# Patient Record
Sex: Male | Born: 1990 | Race: White | Hispanic: No | Smoking: Never smoker
Health system: Southern US, Community
[De-identification: ages and names within clinical notes are randomized; demographics above are authoritative.]

## PROBLEM LIST (undated history)

## (undated) DIAGNOSIS — T7840XA Allergy, unspecified, initial encounter: Secondary | ICD-10-CM

## (undated) HISTORY — DX: Allergy, unspecified, initial encounter: T78.40XA

---
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
## 2014-09-22 ENCOUNTER — Ambulatory Visit (INDEPENDENT_AMBULATORY_CARE_PROVIDER_SITE_OTHER): Payer: 59

## 2014-09-22 ENCOUNTER — Ambulatory Visit (INDEPENDENT_AMBULATORY_CARE_PROVIDER_SITE_OTHER): Payer: 59 | Admitting: Family Medicine

## 2014-09-22 VITALS — BP 120/70 | HR 72 | Temp 98.3°F | Resp 12 | Ht 70.5 in | Wt 208.0 lb

## 2014-09-22 DIAGNOSIS — M79644 Pain in right finger(s): Secondary | ICD-10-CM

## 2014-09-22 IMAGING — CR DG FINGER THUMB 2+V*R*
2 series · 2 of 2 positions shown · non-contrast
Comparison: None.

CLINICAL DATA: Pain.  No history of trauma

EXAM:
RIGHT THUMB 2+V

[AP (1 of 2)]
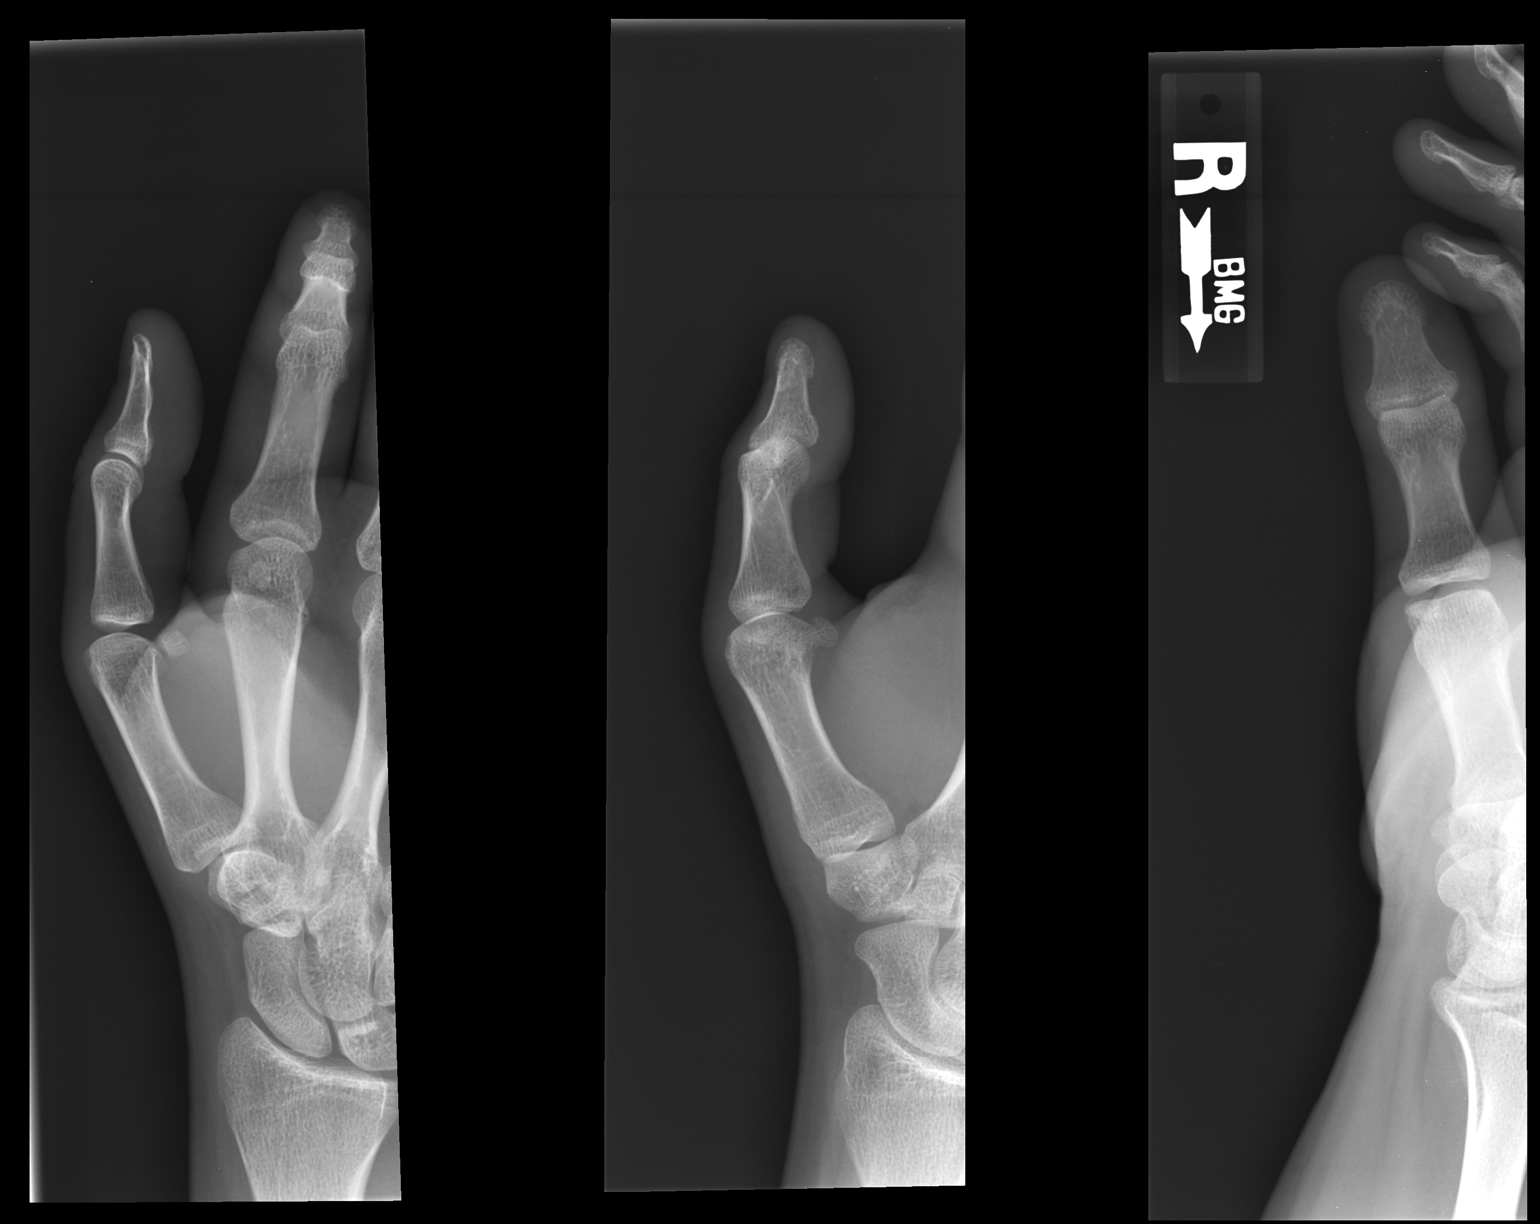

[AP (2 of 2)]
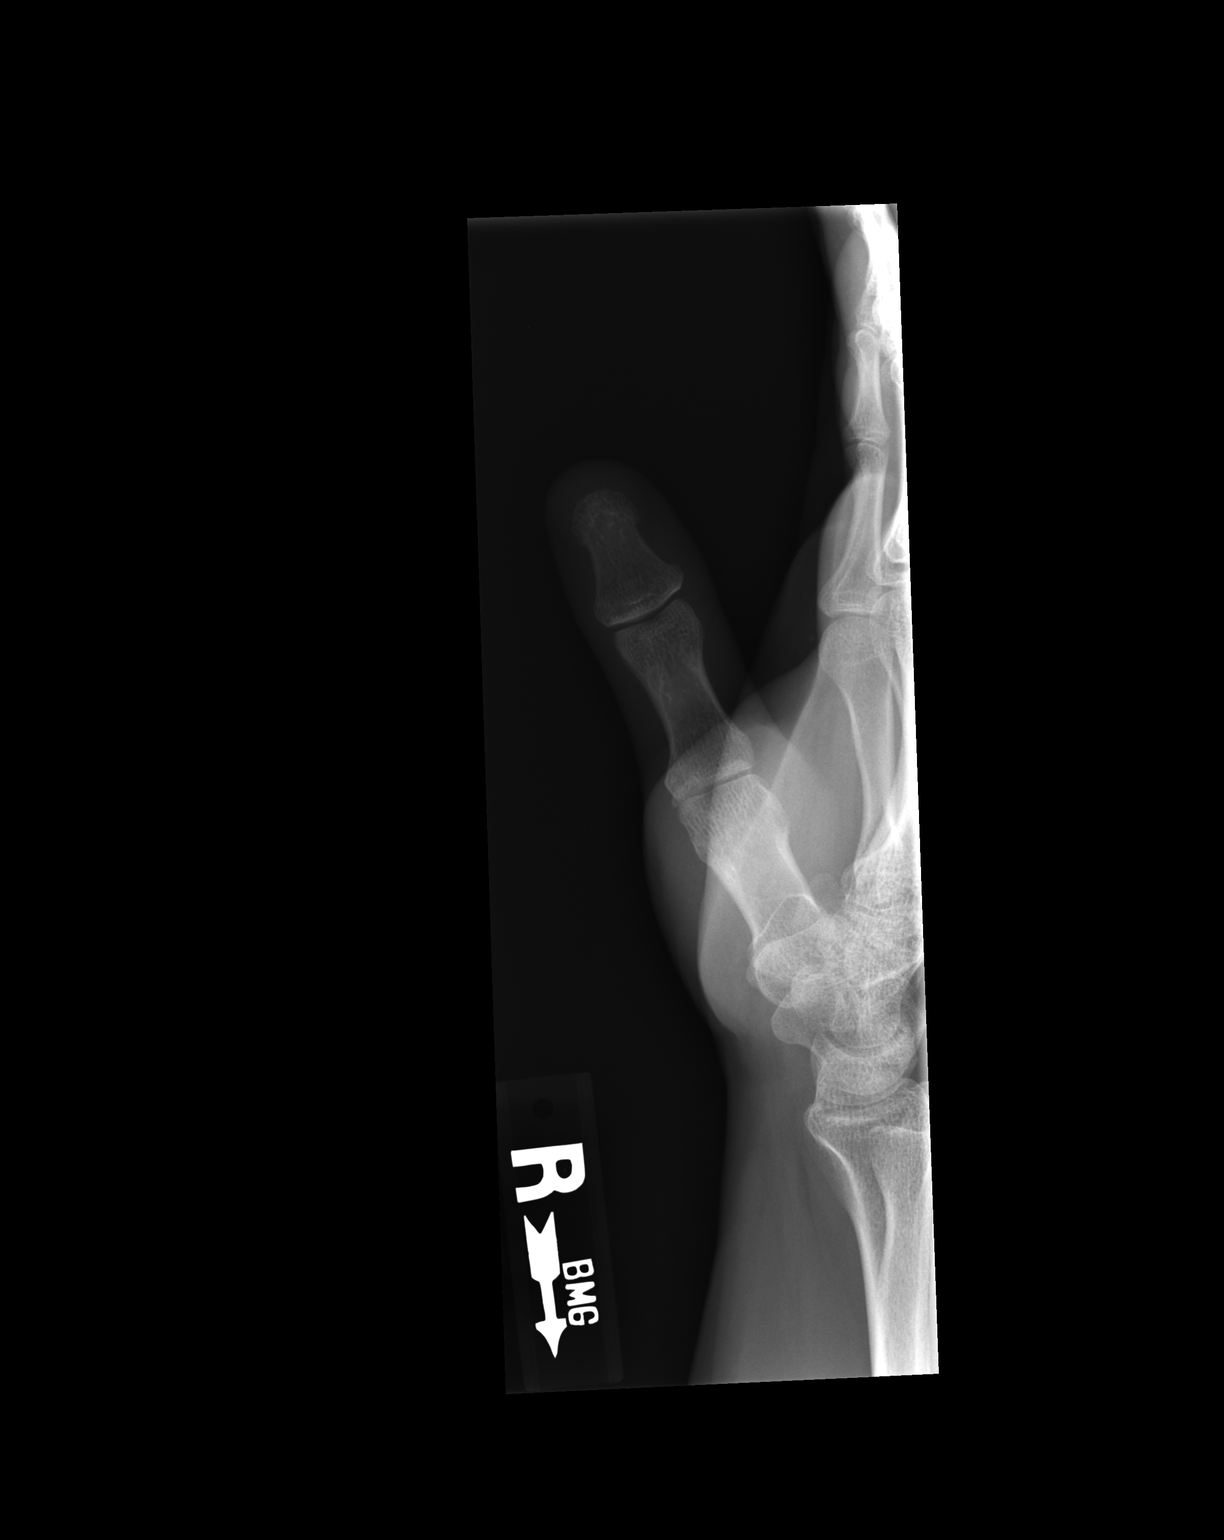

[2 of 2 positions shown; findings below may reference images not displayed]

FINDINGS: Frontal, oblique, and lateral views obtained. There is no
demonstrable fracture or dislocation. Joint spaces appear intact. No
erosive change.
IMPRESSION: No fracture or dislocation.  No appreciable arthropathy.

## 2014-09-22 MED ORDER — HYDROCODONE-ACETAMINOPHEN 5-325 MG PO TABS: 1.0000 | ORAL_TABLET | Freq: Four times a day (QID) | ORAL | Status: AC | PRN

## 2014-09-22 NOTE — Patient Instructions (Signed)
Although the bones appear normal on your x-ray, the clinical picture is one of a gamekeeper thumb. This will need urgent orthopedic attention. In the meantime I will you to continue to wear the splint provided.  Thumb Sprain Your exam shows you have a sprained thumb. This means the ligaments around the joint have been torn. Thumb sprains usually take 3-6 weeks to heal. However, severe, unstable sprains may need to be fixed surgically. Sometimes a small piece of bone is pulled off by the ligament. If this is not treated properly, a sprained thumb can lead to a painful, weak joint. Treatment helps reduce pain and shortens the period of disability. The thumb, and often the wrist, must remain splinted for the first 2-4 weeks to protect the joint. Keep your hand elevated and apply ice packs frequently to the injured area (20-30 minutes every 2-3 hours) for the next 2-4 days. This helps reduce swelling and control pain. Pain medicine may also be used for several days. Motion and strengthening exercises may later be prescribed for the joint to return to normal function. Be sure to see your doctor for follow-up because your thumb joint may require further support with splints, bandages or tape. Please see your doctor or go to the emergency room right away if you have increased pain despite proper treatment, or a numb, cold, or pale thumb. Document Released: 05/05/2004 Document Revised: 06/20/2011 Document Reviewed: 03/29/2008 [facility] Patient Information 2015 [place], [place]. This information is not intended to replace advice given to you by your health care provider. Make sure you discuss any questions you have with your health care provider.

## 2014-09-22 NOTE — Progress Notes (Signed)
This is a 24 year old gentleman who was canoeing yesterday and the paddle got stuck and he strained his right thumb. He has immediate pain and has had continued pain ever since. The pain is made worse by trying to extend the thumb and he cannot make a grip. He's been using a splint ever since the injury.  Patient works at a [employer] in [employer], [employer].  Objective: No acute distress, patient has a soft MCP splint for the thumb on his hand. BP 120/70 mmHg  Pulse 72  Temp(Src) 98.3 F (36.8 C) (Oral)  Resp 12  Ht 5' 10.5" (1.791 m)  Wt 208 lb (94.348 kg)  BMI 29.41 kg/m2  SpO2 98% Examination the thumb reveals diffuse swelling and mild ecchymosis. He has pain with any range of motion of the MCP joint. He is tender along the MCP joint particularly on the radial side. There is no gross deformity.  [facility] reading (PRIMARY) by  Dr. Milus Glazier:  Right thumb--> no fracture seen, no dislocation or graph   assessment: Gamekeeper type thumb injury with significant pain and disability at this point.  Plan: Thumb spica splint, urgent orthopedic referral to hand surgeon  Signed, Sheila Oats.D.

## 2016-06-12 DIAGNOSIS — H52223 Regular astigmatism, bilateral: Secondary | ICD-10-CM | POA: Diagnosis not present

## 2016-11-02 DIAGNOSIS — Z13220 Encounter for screening for lipoid disorders: Secondary | ICD-10-CM | POA: Diagnosis not present

## 2016-11-02 DIAGNOSIS — Z0000 Encounter for general adult medical examination without abnormal findings: Secondary | ICD-10-CM | POA: Diagnosis not present

## 2016-12-01 DIAGNOSIS — K602 Anal fissure, unspecified: Secondary | ICD-10-CM | POA: Diagnosis not present

## 2017-05-14 DIAGNOSIS — H52223 Regular astigmatism, bilateral: Secondary | ICD-10-CM | POA: Diagnosis not present

## 2017-08-23 DIAGNOSIS — S39012A Strain of muscle, fascia and tendon of lower back, initial encounter: Secondary | ICD-10-CM | POA: Diagnosis not present

## 2017-11-08 DIAGNOSIS — Z0000 Encounter for general adult medical examination without abnormal findings: Secondary | ICD-10-CM | POA: Diagnosis not present

## 2018-05-28 DIAGNOSIS — J029 Acute pharyngitis, unspecified: Secondary | ICD-10-CM | POA: Diagnosis not present

## 2018-10-18 DIAGNOSIS — F411 Generalized anxiety disorder: Secondary | ICD-10-CM | POA: Diagnosis not present

## 2018-10-22 DIAGNOSIS — E785 Hyperlipidemia, unspecified: Secondary | ICD-10-CM | POA: Diagnosis not present

## 2018-10-22 DIAGNOSIS — F439 Reaction to severe stress, unspecified: Secondary | ICD-10-CM | POA: Diagnosis not present

## 2018-11-01 DIAGNOSIS — F411 Generalized anxiety disorder: Secondary | ICD-10-CM | POA: Diagnosis not present

## 2018-11-14 DIAGNOSIS — F411 Generalized anxiety disorder: Secondary | ICD-10-CM | POA: Diagnosis not present

## 2018-11-16 DIAGNOSIS — E785 Hyperlipidemia, unspecified: Secondary | ICD-10-CM | POA: Diagnosis not present

## 2018-11-27 DIAGNOSIS — F439 Reaction to severe stress, unspecified: Secondary | ICD-10-CM | POA: Diagnosis not present

## 2018-11-27 DIAGNOSIS — E785 Hyperlipidemia, unspecified: Secondary | ICD-10-CM | POA: Diagnosis not present

## 2018-11-30 DIAGNOSIS — L718 Other rosacea: Secondary | ICD-10-CM | POA: Diagnosis not present

## 2018-12-03 DIAGNOSIS — F411 Generalized anxiety disorder: Secondary | ICD-10-CM | POA: Diagnosis not present

## 2019-02-26 DIAGNOSIS — M25471 Effusion, right ankle: Secondary | ICD-10-CM | POA: Diagnosis not present

## 2019-03-05 DIAGNOSIS — L718 Other rosacea: Secondary | ICD-10-CM | POA: Diagnosis not present

## 2019-03-20 DIAGNOSIS — R0789 Other chest pain: Secondary | ICD-10-CM | POA: Diagnosis not present

## 2019-03-20 DIAGNOSIS — R5383 Other fatigue: Secondary | ICD-10-CM | POA: Diagnosis not present

## 2019-03-20 DIAGNOSIS — G44201 Tension-type headache, unspecified, intractable: Secondary | ICD-10-CM | POA: Diagnosis not present

## 2019-03-21 DIAGNOSIS — R5383 Other fatigue: Secondary | ICD-10-CM | POA: Diagnosis not present

## 2019-03-21 DIAGNOSIS — R0789 Other chest pain: Secondary | ICD-10-CM | POA: Diagnosis not present

## 2019-03-21 DIAGNOSIS — G44201 Tension-type headache, unspecified, intractable: Secondary | ICD-10-CM | POA: Diagnosis not present

## 2019-06-04 DIAGNOSIS — L718 Other rosacea: Secondary | ICD-10-CM | POA: Diagnosis not present

## 2019-08-27 DIAGNOSIS — R635 Abnormal weight gain: Secondary | ICD-10-CM | POA: Diagnosis not present

## 2019-08-27 DIAGNOSIS — E669 Obesity, unspecified: Secondary | ICD-10-CM | POA: Diagnosis not present

## 2019-08-27 DIAGNOSIS — F439 Reaction to severe stress, unspecified: Secondary | ICD-10-CM | POA: Diagnosis not present

## 2019-08-27 DIAGNOSIS — E785 Hyperlipidemia, unspecified: Secondary | ICD-10-CM | POA: Diagnosis not present

## 2019-09-30 DIAGNOSIS — L918 Other hypertrophic disorders of the skin: Secondary | ICD-10-CM | POA: Diagnosis not present

## 2020-03-03 DIAGNOSIS — Z20822 Contact with and (suspected) exposure to covid-19: Secondary | ICD-10-CM | POA: Diagnosis not present

## 2020-03-04 DIAGNOSIS — R52 Pain, unspecified: Secondary | ICD-10-CM | POA: Diagnosis not present

## 2020-03-04 DIAGNOSIS — J019 Acute sinusitis, unspecified: Secondary | ICD-10-CM | POA: Diagnosis not present

## 2020-03-04 DIAGNOSIS — B9689 Other specified bacterial agents as the cause of diseases classified elsewhere: Secondary | ICD-10-CM | POA: Diagnosis not present

## 2020-03-04 DIAGNOSIS — Z20822 Contact with and (suspected) exposure to covid-19: Secondary | ICD-10-CM | POA: Diagnosis not present

## 2020-05-21 DIAGNOSIS — M533 Sacrococcygeal disorders, not elsewhere classified: Secondary | ICD-10-CM | POA: Diagnosis not present

## 2020-05-21 DIAGNOSIS — M9905 Segmental and somatic dysfunction of pelvic region: Secondary | ICD-10-CM | POA: Diagnosis not present

## 2020-05-21 DIAGNOSIS — M9903 Segmental and somatic dysfunction of lumbar region: Secondary | ICD-10-CM | POA: Diagnosis not present

## 2020-05-21 DIAGNOSIS — M5137 Other intervertebral disc degeneration, lumbosacral region: Secondary | ICD-10-CM | POA: Diagnosis not present

## 2020-05-21 DIAGNOSIS — M25551 Pain in right hip: Secondary | ICD-10-CM | POA: Diagnosis not present

## 2020-05-25 DIAGNOSIS — M5137 Other intervertebral disc degeneration, lumbosacral region: Secondary | ICD-10-CM | POA: Diagnosis not present

## 2020-05-25 DIAGNOSIS — M9905 Segmental and somatic dysfunction of pelvic region: Secondary | ICD-10-CM | POA: Diagnosis not present

## 2020-05-25 DIAGNOSIS — M25551 Pain in right hip: Secondary | ICD-10-CM | POA: Diagnosis not present

## 2020-05-25 DIAGNOSIS — M9903 Segmental and somatic dysfunction of lumbar region: Secondary | ICD-10-CM | POA: Diagnosis not present

## 2020-05-26 DIAGNOSIS — M25551 Pain in right hip: Secondary | ICD-10-CM | POA: Diagnosis not present

## 2020-05-26 DIAGNOSIS — M9905 Segmental and somatic dysfunction of pelvic region: Secondary | ICD-10-CM | POA: Diagnosis not present

## 2020-05-26 DIAGNOSIS — M5137 Other intervertebral disc degeneration, lumbosacral region: Secondary | ICD-10-CM | POA: Diagnosis not present

## 2020-05-26 DIAGNOSIS — M9903 Segmental and somatic dysfunction of lumbar region: Secondary | ICD-10-CM | POA: Diagnosis not present

## 2020-06-01 DIAGNOSIS — M9903 Segmental and somatic dysfunction of lumbar region: Secondary | ICD-10-CM | POA: Diagnosis not present

## 2020-06-01 DIAGNOSIS — M5137 Other intervertebral disc degeneration, lumbosacral region: Secondary | ICD-10-CM | POA: Diagnosis not present

## 2020-06-01 DIAGNOSIS — M9905 Segmental and somatic dysfunction of pelvic region: Secondary | ICD-10-CM | POA: Diagnosis not present

## 2020-06-01 DIAGNOSIS — M25551 Pain in right hip: Secondary | ICD-10-CM | POA: Diagnosis not present

## 2020-06-02 DIAGNOSIS — M47816 Spondylosis without myelopathy or radiculopathy, lumbar region: Secondary | ICD-10-CM | POA: Diagnosis not present

## 2020-06-02 DIAGNOSIS — M9905 Segmental and somatic dysfunction of pelvic region: Secondary | ICD-10-CM | POA: Diagnosis not present

## 2020-06-02 DIAGNOSIS — M5137 Other intervertebral disc degeneration, lumbosacral region: Secondary | ICD-10-CM | POA: Diagnosis not present

## 2020-06-02 DIAGNOSIS — M9903 Segmental and somatic dysfunction of lumbar region: Secondary | ICD-10-CM | POA: Diagnosis not present

## 2020-06-02 DIAGNOSIS — M25551 Pain in right hip: Secondary | ICD-10-CM | POA: Diagnosis not present

## 2020-06-03 DIAGNOSIS — M9903 Segmental and somatic dysfunction of lumbar region: Secondary | ICD-10-CM | POA: Diagnosis not present

## 2020-06-03 DIAGNOSIS — M25551 Pain in right hip: Secondary | ICD-10-CM | POA: Diagnosis not present

## 2020-06-03 DIAGNOSIS — M9905 Segmental and somatic dysfunction of pelvic region: Secondary | ICD-10-CM | POA: Diagnosis not present

## 2020-06-03 DIAGNOSIS — M5137 Other intervertebral disc degeneration, lumbosacral region: Secondary | ICD-10-CM | POA: Diagnosis not present

## 2020-06-04 DIAGNOSIS — D2261 Melanocytic nevi of right upper limb, including shoulder: Secondary | ICD-10-CM | POA: Diagnosis not present

## 2020-06-04 DIAGNOSIS — L718 Other rosacea: Secondary | ICD-10-CM | POA: Diagnosis not present

## 2020-06-04 DIAGNOSIS — L814 Other melanin hyperpigmentation: Secondary | ICD-10-CM | POA: Diagnosis not present

## 2020-06-04 DIAGNOSIS — D2262 Melanocytic nevi of left upper limb, including shoulder: Secondary | ICD-10-CM | POA: Diagnosis not present

## 2020-06-08 DIAGNOSIS — M25551 Pain in right hip: Secondary | ICD-10-CM | POA: Diagnosis not present

## 2020-06-08 DIAGNOSIS — M9903 Segmental and somatic dysfunction of lumbar region: Secondary | ICD-10-CM | POA: Diagnosis not present

## 2020-06-08 DIAGNOSIS — M5137 Other intervertebral disc degeneration, lumbosacral region: Secondary | ICD-10-CM | POA: Diagnosis not present

## 2020-06-08 DIAGNOSIS — M9905 Segmental and somatic dysfunction of pelvic region: Secondary | ICD-10-CM | POA: Diagnosis not present

## 2020-06-09 DIAGNOSIS — M5137 Other intervertebral disc degeneration, lumbosacral region: Secondary | ICD-10-CM | POA: Diagnosis not present

## 2020-06-09 DIAGNOSIS — M9905 Segmental and somatic dysfunction of pelvic region: Secondary | ICD-10-CM | POA: Diagnosis not present

## 2020-06-09 DIAGNOSIS — M25551 Pain in right hip: Secondary | ICD-10-CM | POA: Diagnosis not present

## 2020-06-09 DIAGNOSIS — M9903 Segmental and somatic dysfunction of lumbar region: Secondary | ICD-10-CM | POA: Diagnosis not present

## 2020-06-11 DIAGNOSIS — M9903 Segmental and somatic dysfunction of lumbar region: Secondary | ICD-10-CM | POA: Diagnosis not present

## 2020-06-11 DIAGNOSIS — M5137 Other intervertebral disc degeneration, lumbosacral region: Secondary | ICD-10-CM | POA: Diagnosis not present

## 2020-06-11 DIAGNOSIS — M9905 Segmental and somatic dysfunction of pelvic region: Secondary | ICD-10-CM | POA: Diagnosis not present

## 2020-06-11 DIAGNOSIS — M25551 Pain in right hip: Secondary | ICD-10-CM | POA: Diagnosis not present

## 2020-06-15 DIAGNOSIS — M5137 Other intervertebral disc degeneration, lumbosacral region: Secondary | ICD-10-CM | POA: Diagnosis not present

## 2020-06-15 DIAGNOSIS — M25551 Pain in right hip: Secondary | ICD-10-CM | POA: Diagnosis not present

## 2020-06-15 DIAGNOSIS — M9905 Segmental and somatic dysfunction of pelvic region: Secondary | ICD-10-CM | POA: Diagnosis not present

## 2020-06-15 DIAGNOSIS — M9903 Segmental and somatic dysfunction of lumbar region: Secondary | ICD-10-CM | POA: Diagnosis not present

## 2020-06-17 DIAGNOSIS — M9905 Segmental and somatic dysfunction of pelvic region: Secondary | ICD-10-CM | POA: Diagnosis not present

## 2020-06-17 DIAGNOSIS — M9903 Segmental and somatic dysfunction of lumbar region: Secondary | ICD-10-CM | POA: Diagnosis not present

## 2020-06-17 DIAGNOSIS — M5137 Other intervertebral disc degeneration, lumbosacral region: Secondary | ICD-10-CM | POA: Diagnosis not present

## 2020-06-17 DIAGNOSIS — M25551 Pain in right hip: Secondary | ICD-10-CM | POA: Diagnosis not present

## 2020-06-22 DIAGNOSIS — M9905 Segmental and somatic dysfunction of pelvic region: Secondary | ICD-10-CM | POA: Diagnosis not present

## 2020-06-22 DIAGNOSIS — M25551 Pain in right hip: Secondary | ICD-10-CM | POA: Diagnosis not present

## 2020-06-22 DIAGNOSIS — M5137 Other intervertebral disc degeneration, lumbosacral region: Secondary | ICD-10-CM | POA: Diagnosis not present

## 2020-06-22 DIAGNOSIS — M9903 Segmental and somatic dysfunction of lumbar region: Secondary | ICD-10-CM | POA: Diagnosis not present

## 2020-06-25 DIAGNOSIS — M9903 Segmental and somatic dysfunction of lumbar region: Secondary | ICD-10-CM | POA: Diagnosis not present

## 2020-06-25 DIAGNOSIS — M5137 Other intervertebral disc degeneration, lumbosacral region: Secondary | ICD-10-CM | POA: Diagnosis not present

## 2020-06-25 DIAGNOSIS — M9905 Segmental and somatic dysfunction of pelvic region: Secondary | ICD-10-CM | POA: Diagnosis not present

## 2020-06-25 DIAGNOSIS — M25551 Pain in right hip: Secondary | ICD-10-CM | POA: Diagnosis not present

## 2020-07-02 DIAGNOSIS — M5137 Other intervertebral disc degeneration, lumbosacral region: Secondary | ICD-10-CM | POA: Diagnosis not present

## 2020-07-02 DIAGNOSIS — M9903 Segmental and somatic dysfunction of lumbar region: Secondary | ICD-10-CM | POA: Diagnosis not present

## 2020-07-02 DIAGNOSIS — M9905 Segmental and somatic dysfunction of pelvic region: Secondary | ICD-10-CM | POA: Diagnosis not present

## 2020-07-02 DIAGNOSIS — M25551 Pain in right hip: Secondary | ICD-10-CM | POA: Diagnosis not present

## 2020-07-09 DIAGNOSIS — M9905 Segmental and somatic dysfunction of pelvic region: Secondary | ICD-10-CM | POA: Diagnosis not present

## 2020-07-09 DIAGNOSIS — M5137 Other intervertebral disc degeneration, lumbosacral region: Secondary | ICD-10-CM | POA: Diagnosis not present

## 2020-07-09 DIAGNOSIS — M25551 Pain in right hip: Secondary | ICD-10-CM | POA: Diagnosis not present

## 2020-07-09 DIAGNOSIS — M9903 Segmental and somatic dysfunction of lumbar region: Secondary | ICD-10-CM | POA: Diagnosis not present

## 2020-07-16 DIAGNOSIS — M5137 Other intervertebral disc degeneration, lumbosacral region: Secondary | ICD-10-CM | POA: Diagnosis not present

## 2020-07-16 DIAGNOSIS — M25551 Pain in right hip: Secondary | ICD-10-CM | POA: Diagnosis not present

## 2020-07-16 DIAGNOSIS — M9905 Segmental and somatic dysfunction of pelvic region: Secondary | ICD-10-CM | POA: Diagnosis not present

## 2020-07-16 DIAGNOSIS — M9903 Segmental and somatic dysfunction of lumbar region: Secondary | ICD-10-CM | POA: Diagnosis not present

## 2020-08-31 DIAGNOSIS — Z130 Encounter for screening for diseases of the blood and blood-forming organs and certain disorders involving the immune mechanism: Secondary | ICD-10-CM | POA: Diagnosis not present

## 2020-08-31 DIAGNOSIS — F410 Panic disorder [episodic paroxysmal anxiety] without agoraphobia: Secondary | ICD-10-CM | POA: Diagnosis not present

## 2020-08-31 DIAGNOSIS — R748 Abnormal levels of other serum enzymes: Secondary | ICD-10-CM | POA: Diagnosis not present

## 2020-08-31 DIAGNOSIS — Z113 Encounter for screening for infections with a predominantly sexual mode of transmission: Secondary | ICD-10-CM | POA: Diagnosis not present

## 2020-08-31 DIAGNOSIS — Z0000 Encounter for general adult medical examination without abnormal findings: Secondary | ICD-10-CM | POA: Diagnosis not present

## 2020-08-31 DIAGNOSIS — F411 Generalized anxiety disorder: Secondary | ICD-10-CM | POA: Diagnosis not present

## 2021-06-04 DIAGNOSIS — D2272 Melanocytic nevi of left lower limb, including hip: Secondary | ICD-10-CM | POA: Diagnosis not present

## 2021-06-04 DIAGNOSIS — D1801 Hemangioma of skin and subcutaneous tissue: Secondary | ICD-10-CM | POA: Diagnosis not present

## 2021-06-04 DIAGNOSIS — L718 Other rosacea: Secondary | ICD-10-CM | POA: Diagnosis not present

## 2021-06-04 DIAGNOSIS — D225 Melanocytic nevi of trunk: Secondary | ICD-10-CM | POA: Diagnosis not present

## 2021-06-24 ENCOUNTER — Other Ambulatory Visit: Payer: Self-pay

## 2021-06-24 ENCOUNTER — Ambulatory Visit (HOSPITAL_COMMUNITY)
Admission: EM | Admit: 2021-06-24 | Discharge: 2021-06-24 | Disposition: A | Discharge status: Home / Self Care | Payer: BC Managed Care – PPO | Attending: Emergency Medicine | Admitting: Emergency Medicine

## 2021-06-24 ENCOUNTER — Encounter (HOSPITAL_COMMUNITY)
Admission: EM | Disposition: A | Discharge status: Home / Self Care | Payer: Self-pay | Source: Home / Self Care | Attending: Emergency Medicine

## 2021-06-24 ENCOUNTER — Other Ambulatory Visit: Payer: Self-pay | Admitting: Family Medicine

## 2021-06-24 ENCOUNTER — Emergency Department (HOSPITAL_COMMUNITY): Payer: BC Managed Care – PPO | Admitting: Anesthesiology

## 2021-06-24 ENCOUNTER — Ambulatory Visit
Admission: RE | Admit: 2021-06-24 | Discharge: 2021-06-24 | Disposition: A | Discharge status: Home / Self Care | Payer: Self-pay | Source: Ambulatory Visit | Attending: Family Medicine | Admitting: Family Medicine

## 2021-06-24 ENCOUNTER — Encounter (HOSPITAL_COMMUNITY): Payer: Self-pay | Admitting: Emergency Medicine

## 2021-06-24 ENCOUNTER — Other Ambulatory Visit: Payer: Self-pay | Admitting: Emergency Medicine

## 2021-06-24 DIAGNOSIS — K3580 Unspecified acute appendicitis: Secondary | ICD-10-CM | POA: Insufficient documentation

## 2021-06-24 DIAGNOSIS — K3189 Other diseases of stomach and duodenum: Secondary | ICD-10-CM | POA: Diagnosis not present

## 2021-06-24 DIAGNOSIS — Z20822 Contact with and (suspected) exposure to covid-19: Secondary | ICD-10-CM | POA: Insufficient documentation

## 2021-06-24 DIAGNOSIS — K37 Unspecified appendicitis: Secondary | ICD-10-CM | POA: Diagnosis not present

## 2021-06-24 DIAGNOSIS — R1030 Lower abdominal pain, unspecified: Secondary | ICD-10-CM | POA: Diagnosis not present

## 2021-06-24 HISTORY — PX: LAPAROSCOPIC APPENDECTOMY: SHX408

## 2021-06-24 LAB — URINALYSIS, ROUTINE W REFLEX MICROSCOPIC
Bilirubin Urine: NEGATIVE
Glucose, UA: NEGATIVE mg/dL
Ketones, ur: 5 mg/dL — AB
Leukocytes,Ua: NEGATIVE
Nitrite: NEGATIVE
Protein, ur: NEGATIVE mg/dL
Specific Gravity, Urine: >1.046 — ABNORMAL HIGH (ref 1.005–1.030)
pH: 5 (ref 5.0–8.0)

## 2021-06-24 LAB — CBC
HCT: 45.2 % (ref 39.0–52.0)
Hemoglobin: 15 g/dL (ref 13.0–17.0)
MCH: 30.9 pg (ref 26.0–34.0)
MCHC: 33.2 g/dL (ref 30.0–36.0)
MCV: 93 fL (ref 80.0–100.0)
Platelets: 347 10*3/uL (ref 150–400)
RBC: 4.86 MIL/uL (ref 4.22–5.81)
RDW: 12.2 % (ref 11.5–15.5)
WBC: 7.7 10*3/uL (ref 4.0–10.5)
nRBC: 0 % (ref 0.0–0.2)

## 2021-06-24 LAB — COMPREHENSIVE METABOLIC PANEL
ALT: 43 U/L (ref 0–44)
AST: 29 U/L (ref 15–41)
Albumin: 4.3 g/dL (ref 3.5–5.0)
Alkaline Phosphatase: 41 U/L (ref 38–126)
Anion gap: 11 (ref 5–15)
BUN: 10 mg/dL (ref 6–20)
CO2: 23 mmol/L (ref 22–32)
Calcium: 9 mg/dL (ref 8.9–10.3)
Chloride: 104 mmol/L (ref 98–111)
Creatinine, Ser: 1.01 mg/dL (ref 0.61–1.24)
GFR, Estimated: >60 mL/min (ref >60)
Glucose, Bld: 87 mg/dL (ref 70–99)
Potassium: 4.2 mmol/L (ref 3.5–5.1)
Sodium: 138 mmol/L (ref 135–145)
Total Bilirubin: 0.7 mg/dL (ref 0.3–1.2)
Total Protein: 7.1 g/dL (ref 6.5–8.1)

## 2021-06-24 LAB — RESP PANEL BY RT-PCR (FLU A&B, COVID) ARPGX2
Influenza A by PCR: NEGATIVE
Influenza B by PCR: NEGATIVE
SARS Coronavirus 2 by RT PCR: NEGATIVE

## 2021-06-24 LAB — LIPASE, BLOOD: Lipase: 31 U/L (ref 11–51)

## 2021-06-24 SURGERY — APPENDECTOMY, LAPAROSCOPIC
Anesthesia: General | Site: Abdomen

## 2021-06-24 MED ORDER — IOPAMIDOL (ISOVUE-300) INJECTION 61%
100.0000 mL | Freq: Once | INTRAVENOUS | Status: AC | PRN
  Administered 2021-06-24: 100 mL via INTRAVENOUS

## 2021-06-24 MED ORDER — PHENYLEPHRINE 40 MCG/ML (10ML) SYRINGE FOR IV PUSH (FOR BLOOD PRESSURE SUPPORT)
PREFILLED_SYRINGE | INTRAVENOUS | Status: AC
  Filled 2021-06-24: qty 10

## 2021-06-24 MED ORDER — FENTANYL CITRATE (PF) 100 MCG/2ML IJ SOLN
INTRAMUSCULAR | Status: AC
  Filled 2021-06-24: qty 2

## 2021-06-24 MED ORDER — SODIUM CHLORIDE 0.9 % IR SOLN
Status: DC | PRN
  Administered 2021-06-24: 1000 mL

## 2021-06-24 MED ORDER — SUCCINYLCHOLINE 20MG/ML (10ML) SYRINGE FOR MEDFUSION PUMP - OPTIME
INTRAMUSCULAR | Status: DC | PRN
Start: 2021-06-24 — End: 2021-06-24
  Administered 2021-06-24: 160 mg via INTRAVENOUS

## 2021-06-24 MED ORDER — BUPIVACAINE HCL (PF) 0.25 % IJ SOLN
INTRAMUSCULAR | Status: AC
  Filled 2021-06-24: qty 30

## 2021-06-24 MED ORDER — FENTANYL CITRATE (PF) 250 MCG/5ML IJ SOLN
INTRAMUSCULAR | Status: AC
  Filled 2021-06-24: qty 5

## 2021-06-24 MED ORDER — ACETAMINOPHEN 500 MG PO TABS
ORAL_TABLET | ORAL | Status: AC
  Administered 2021-06-24: 1000 mg
  Filled 2021-06-24: qty 2

## 2021-06-24 MED ORDER — SUGAMMADEX SODIUM 200 MG/2ML IV SOLN
INTRAVENOUS | Status: DC | PRN
  Administered 2021-06-24: 250 mg via INTRAVENOUS

## 2021-06-24 MED ORDER — PHENYLEPHRINE HCL (PRESSORS) 10 MG/ML IV SOLN
INTRAVENOUS | Status: DC | PRN
Start: 2021-06-24 — End: 2021-06-24
  Administered 2021-06-24: 40 ug via INTRAVENOUS

## 2021-06-24 MED ORDER — LIDOCAINE HCL (CARDIAC) PF 100 MG/5ML IV SOSY
PREFILLED_SYRINGE | INTRAVENOUS | Status: DC | PRN
  Administered 2021-06-24: 100 mg via INTRAVENOUS

## 2021-06-24 MED ORDER — ROCURONIUM 10MG/ML (10ML) SYRINGE FOR MEDFUSION PUMP - OPTIME
INTRAVENOUS | Status: DC | PRN
  Administered 2021-06-24: 40 mg via INTRAVENOUS

## 2021-06-24 MED ORDER — PROPOFOL 10 MG/ML IV BOLUS
INTRAVENOUS | Status: DC | PRN
  Administered 2021-06-24: 200 mg via INTRAVENOUS

## 2021-06-24 MED ORDER — AMISULPRIDE (ANTIEMETIC) 5 MG/2ML IV SOLN: 10.0000 mg | Freq: Once | INTRAVENOUS | Status: DC | PRN

## 2021-06-24 MED ORDER — DEXAMETHASONE SODIUM PHOSPHATE 10 MG/ML IJ SOLN
INTRAMUSCULAR | Status: DC | PRN
  Administered 2021-06-24: 10 mg via INTRAVENOUS

## 2021-06-24 MED ORDER — FENTANYL CITRATE (PF) 100 MCG/2ML IJ SOLN
25.0000 ug | INTRAMUSCULAR | Status: DC | PRN
  Administered 2021-06-24: 50 ug via INTRAVENOUS

## 2021-06-24 MED ORDER — SCOPOLAMINE 1 MG/3DAYS TD PT72
MEDICATED_PATCH | TRANSDERMAL | Status: DC | PRN
  Administered 2021-06-24: 1 via TRANSDERMAL

## 2021-06-24 MED ORDER — 0.9 % SODIUM CHLORIDE (POUR BTL) OPTIME
TOPICAL | Status: DC | PRN
  Administered 2021-06-24: 1000 mL

## 2021-06-24 MED ORDER — LACTATED RINGERS IV SOLN: INTRAVENOUS | Status: DC | PRN

## 2021-06-24 MED ORDER — ONDANSETRON HCL 4 MG/2ML IJ SOLN
INTRAMUSCULAR | Status: AC
  Filled 2021-06-24: qty 2

## 2021-06-24 MED ORDER — DEXAMETHASONE SODIUM PHOSPHATE 10 MG/ML IJ SOLN
INTRAMUSCULAR | Status: AC
  Filled 2021-06-24: qty 1

## 2021-06-24 MED ORDER — TRAMADOL HCL 50 MG PO TABS: 50.0000 mg | ORAL_TABLET | Freq: Four times a day (QID) | ORAL | 0 refills | Status: AC | PRN

## 2021-06-24 MED ORDER — SCOPOLAMINE 1 MG/3DAYS TD PT72
MEDICATED_PATCH | TRANSDERMAL | Status: AC
  Filled 2021-06-24: qty 1

## 2021-06-24 MED ORDER — BUPIVACAINE HCL 0.25 % IJ SOLN
INTRAMUSCULAR | Status: DC | PRN
  Administered 2021-06-24: 6 mL

## 2021-06-24 MED ORDER — ROCURONIUM BROMIDE 10 MG/ML (PF) SYRINGE
PREFILLED_SYRINGE | INTRAVENOUS | Status: AC
  Filled 2021-06-24: qty 10

## 2021-06-24 MED ORDER — FENTANYL CITRATE (PF) 250 MCG/5ML IJ SOLN
INTRAMUSCULAR | Status: DC | PRN
  Administered 2021-06-24: 100 ug via INTRAVENOUS
  Administered 2021-06-24: 50 ug via INTRAVENOUS

## 2021-06-24 MED ORDER — SODIUM CHLORIDE 0.9 % IV SOLN
2.0000 g | Freq: Once | INTRAVENOUS | Status: AC
  Administered 2021-06-24: 2 g via INTRAVENOUS
  Filled 2021-06-24: qty 20

## 2021-06-24 MED ORDER — BSS IO SOLN
INTRAOCULAR | Status: AC
  Filled 2021-06-24: qty 15

## 2021-06-24 MED ORDER — PROCHLORPERAZINE EDISYLATE 10 MG/2ML IJ SOLN: 10.0000 mg | Freq: Once | INTRAMUSCULAR | Status: DC

## 2021-06-24 MED ORDER — MIDAZOLAM HCL 2 MG/2ML IJ SOLN
INTRAMUSCULAR | Status: DC | PRN
  Administered 2021-06-24: 2 mg via INTRAVENOUS

## 2021-06-24 MED ORDER — MIDAZOLAM HCL 2 MG/2ML IJ SOLN
INTRAMUSCULAR | Status: AC
  Filled 2021-06-24: qty 2

## 2021-06-24 MED ORDER — CHLORHEXIDINE GLUCONATE 0.12 % MT SOLN
OROMUCOSAL | Status: AC
  Administered 2021-06-24: 15 mL
  Filled 2021-06-24: qty 15

## 2021-06-24 MED ORDER — ONDANSETRON HCL 4 MG/2ML IJ SOLN
INTRAMUSCULAR | Status: DC | PRN
  Administered 2021-06-24: 4 mg via INTRAVENOUS

## 2021-06-24 MED ORDER — CELECOXIB 200 MG PO CAPS
ORAL_CAPSULE | ORAL | Status: AC
  Administered 2021-06-24: 200 mg
  Filled 2021-06-24: qty 1

## 2021-06-24 MED ORDER — METRONIDAZOLE 500 MG/100ML IV SOLN
500.0000 mg | Freq: Once | INTRAVENOUS | Status: AC
  Administered 2021-06-24: 500 mg via INTRAVENOUS
  Filled 2021-06-24: qty 100

## 2021-06-24 MED ORDER — SUGAMMADEX SODIUM 500 MG/5ML IV SOLN
INTRAVENOUS | Status: AC
  Filled 2021-06-24: qty 5

## 2021-06-24 SURGICAL SUPPLY — 48 items
APPLIER CLIP 5 13 M/L LIGAMAX5 (MISCELLANEOUS)
BAG COUNTER SPONGE SURGICOUNT (BAG) ×2 IMPLANT
BLADE CLIPPER SURG (BLADE) IMPLANT
CANISTER SUCT 3000ML PPV (MISCELLANEOUS) ×2 IMPLANT
CHLORAPREP W/TINT 26 (MISCELLANEOUS) ×2 IMPLANT
CLIP APPLIE 5 13 M/L LIGAMAX5 (MISCELLANEOUS) IMPLANT
CLIP LIGATING HEMO LOK XL GOLD (MISCELLANEOUS) ×2 IMPLANT
COVER SURGICAL LIGHT HANDLE (MISCELLANEOUS) ×2 IMPLANT
COVER TRANSDUCER ULTRASND (DRAPES) ×2 IMPLANT
DERMABOND ADHESIVE PROPEN (GAUZE/BANDAGES/DRESSINGS) ×1
DERMABOND ADVANCED (GAUZE/BANDAGES/DRESSINGS) ×1
DERMABOND ADVANCED .7 DNX12 (GAUZE/BANDAGES/DRESSINGS) ×1 IMPLANT
DERMABOND ADVANCED .7 DNX6 (GAUZE/BANDAGES/DRESSINGS) IMPLANT
ELECT REM PT RETURN 9FT ADLT (ELECTROSURGICAL) ×2
ELECTRODE REM PT RTRN 9FT ADLT (ELECTROSURGICAL) ×1 IMPLANT
ENDOLOOP SUT PDS II  0 18 (SUTURE)
ENDOLOOP SUT PDS II 0 18 (SUTURE) IMPLANT
GAUZE 4X4 16PLY ~~LOC~~+RFID DBL (SPONGE) ×1 IMPLANT
GLOVE SURG SYN 7.5  E (GLOVE) ×2
GLOVE SURG SYN 7.5 E (GLOVE) ×1 IMPLANT
GLOVE SURG SYN 7.5 PF PI (GLOVE) ×1 IMPLANT
GOWN STRL REUS W/ TWL LRG LVL3 (GOWN DISPOSABLE) ×2 IMPLANT
GOWN STRL REUS W/ TWL XL LVL3 (GOWN DISPOSABLE) ×1 IMPLANT
GOWN STRL REUS W/TWL LRG LVL3 (GOWN DISPOSABLE) ×4
GOWN STRL REUS W/TWL XL LVL3 (GOWN DISPOSABLE) ×2
GRASPER SUT TROCAR 14GX15 (MISCELLANEOUS) ×2 IMPLANT
KIT BASIN OR (CUSTOM PROCEDURE TRAY) ×2 IMPLANT
KIT TURNOVER KIT B (KITS) ×2 IMPLANT
NDL INSUFFLATION 14GA 120MM (NEEDLE) ×1 IMPLANT
NEEDLE INSUFFLATION 14GA 120MM (NEEDLE) ×2 IMPLANT
NS IRRIG 1000ML POUR BTL (IV SOLUTION) ×2 IMPLANT
PAD ARMBOARD 7.5X6 YLW CONV (MISCELLANEOUS) ×4 IMPLANT
SCISSORS LAP 5X35 DISP (ENDOMECHANICALS) ×2 IMPLANT
SET IRRIG TUBING LAPAROSCOPIC (IRRIGATION / IRRIGATOR) ×2 IMPLANT
SET TUBE SMOKE EVAC HIGH FLOW (TUBING) ×2 IMPLANT
SLEEVE ENDOPATH XCEL 5M (ENDOMECHANICALS) ×2 IMPLANT
SPECIMEN JAR SMALL (MISCELLANEOUS) ×2 IMPLANT
SPONGE T-LAP 18X18 ~~LOC~~+RFID (SPONGE) ×1 IMPLANT
SUT MNCRL AB 4-0 PS2 18 (SUTURE) ×2 IMPLANT
TOWEL GREEN STERILE (TOWEL DISPOSABLE) ×2 IMPLANT
TOWEL GREEN STERILE FF (TOWEL DISPOSABLE) ×2 IMPLANT
TRAY FOLEY W/BAG SLVR 16FR (SET/KITS/TRAYS/PACK)
TRAY FOLEY W/BAG SLVR 16FR ST (SET/KITS/TRAYS/PACK) IMPLANT
TRAY LAPAROSCOPIC MC (CUSTOM PROCEDURE TRAY) ×2 IMPLANT
TROCAR XCEL NON-BLD 11X100MML (ENDOMECHANICALS) ×2 IMPLANT
TROCAR XCEL NON-BLD 5MMX100MML (ENDOMECHANICALS) ×2 IMPLANT
WARMER LAPAROSCOPE (MISCELLANEOUS) ×2 IMPLANT
WATER STERILE IRR 1000ML POUR (IV SOLUTION) ×2 IMPLANT

## 2021-06-24 NOTE — ED Provider Notes (Signed)
?[facility] EMERGENCY DEPARTMENT ?Provider Note ? ? ?CSN: [id number] ?Arrival date & time: 06/24/21  1601 ? ?  ? ?History ? ?Chief Complaint  ?Patient presents with  ? Abdominal Pain  ? ? ?AVINASH MALTOS is a 31 y.o. male with no medical history.  Patient presents ED for evaluation of abdominal pain.  Patient states that beginning on Tuesday he was experiencing diffuse abdominal pain that then localized to his right lower quadrant yesterday.  Patient states that the pain comes and goes.  Patient describes the pain as a "pressure, cough all of my abdomen".  Patient reports that he was seen by his PCP earlier today and had outpatient CT scan performed which was positive for appendicitis.  Patient sent to ED for further evaluation.  On examination, patient endorses right lower quadrant pain.  He denies fevers, chills, nausea, vomiting, diarrhea, issues with urination. ? ? ? ?Abdominal Pain ?Associated symptoms: no chills, no diarrhea, no fever, no nausea and no vomiting   ? ?  ? ?Home Medications ?Prior to Admission medications   ?Medication Sig Start Date End Date Taking? Authorizing Provider  ?ALPRAZolam (XANAX) 0.5 MG tablet Take 0.5 mg by mouth 2 (two) times daily as needed for anxiety. 03/18/21  Yes [provider]  ?cetirizine (ZYRTEC) 10 MG tablet Take 10 mg by mouth daily.   Yes [provider]  ?cyclobenzaprine (FLEXERIL) 5 MG tablet Take 5 mg by mouth 3 (three) times daily as needed for muscle spasms.   Yes [provider]  ?escitalopram (LEXAPRO) 10 MG tablet Take 10 mg by mouth daily. 06/02/21  Yes [provider]  ?ibuprofen (ADVIL) 200 MG tablet Take 200 mg by mouth every 6 (six) hours as needed for moderate pain.   Yes [provider]  ?ketoconazole (NIZORAL) 2 % shampoo Apply 1 application. topically 2 (two) times a week. Monday & Thursday 04/27/21  Yes [provider]  ?melatonin 5 MG TABS Take 5 mg by mouth at bedtime as needed  (sleep).   Yes [provider]  ?metronidazole (NORITATE) 1 % cream Apply 1 application. topically daily.   Yes [provider]  ?HYDROcodone-acetaminophen (NORCO) 5-325 MG per tablet Take 1 tablet by mouth every 6 (six) hours as needed for moderate pain. ?Patient not taking: Reported on 06/24/2021 09/22/14   Elvina Sidle, MD  ?   ? ?Allergies    ?Patient has no known allergies.   ? ?Review of Systems   ?Review of Systems  ?Constitutional:  Negative for chills and fever.  ?Gastrointestinal:  Positive for abdominal pain. Negative for diarrhea, nausea and vomiting.  ?Genitourinary:  Negative for difficulty urinating.  ?All other systems reviewed and are negative. ? ?Physical Exam ?Updated Vital Signs ?BP (!) 145/105 (BP Location: Right Arm)   Pulse (!) 104   Temp 99.1 ?F (37.3 ?C) (Oral)   Resp 20   Ht 5\' 10"  (1.778 m)   Wt 115.2 kg   SpO2 98%   BMI 36.45 kg/m?  ?Physical Exam ?Constitutional:   ?   General: He is not in acute distress. ?   Appearance: He is not ill-appearing, toxic-appearing or diaphoretic.  ?HENT:  ?   Head: Normocephalic and atraumatic.  ?   Nose: Nose normal. No congestion.  ?   Mouth/Throat:  ?   Mouth: Mucous membranes are moist.  ?   Pharynx: Oropharynx is clear.  ?Eyes:  ?   Extraocular Movements: Extraocular movements intact.  ?   Conjunctiva/sclera:  Conjunctivae normal.  ?   Pupils: Pupils are equal, round, and reactive to light.  ?Cardiovascular:  ?   Rate and Rhythm: Normal rate and regular rhythm.  ?Pulmonary:  ?   Effort: Pulmonary effort is normal.  ?   Breath sounds: Normal breath sounds. No wheezing.  ?Abdominal:  ?   General: Abdomen is flat. Bowel sounds are normal. There is no distension.  ?   Palpations: Abdomen is soft.  ?   Tenderness: There is abdominal tenderness in the right lower quadrant. Positive signs include McBurney's sign.  ?Musculoskeletal:  ?   Cervical back: Normal range of motion and neck supple. No tenderness.  ?Skin: ?   General: Skin  is warm and dry.  ?   Capillary Refill: Capillary refill takes less than 2 seconds.  ?Neurological:  ?   Mental Status: He is alert and oriented to person, place, and time.  ? ? ?ED Results / Procedures / Treatments   ?Labs ?(all labs ordered are listed, but only abnormal results are displayed) ?Labs Reviewed  ?URINALYSIS, ROUTINE W REFLEX MICROSCOPIC - Abnormal; Notable for the following components:  ?    Result Value  ? Specific Gravity, Urine >1.046 (*)   ? Hgb urine dipstick SMALL (*)   ? Ketones, ur 5 (*)   ? Bacteria, UA RARE (*)   ? All other components within normal limits  ?RESP PANEL BY RT-PCR (FLU A&B, COVID) ARPGX2  ?LIPASE, BLOOD  ?COMPREHENSIVE METABOLIC PANEL  ?CBC  ? ? ?EKG ?None ? ?Radiology ?CT ABDOMEN PELVIS W CONTRAST ? ?Result Date: 06/24/2021 ?CLINICAL DATA:  31 year old male with clinical concern for appendicitis. EXAM: CT ABDOMEN AND PELVIS WITH CONTRAST TECHNIQUE: Multidetector CT imaging of the abdomen and pelvis was performed using the standard protocol following bolus administration of intravenous contrast. RADIATION DOSE REDUCTION: This exam was performed according to the departmental dose-optimization program which includes automated exposure control, adjustment of the mA and/or kV according to patient size and/or use of iterative reconstruction technique. CONTRAST:  ISOVUE-300 IOPAMIDOL (ISOVUE-300) INJECTION 61% COMPARISON:  None. FINDINGS: Lower chest: No acute abnormality. Hepatobiliary: No focal liver abnormality is seen. No gallstones, gallbladder wall thickening, or biliary dilatation. Pancreas: Unremarkable. No pancreatic ductal dilatation or surrounding inflammatory changes. Spleen: Normal in size without focal abnormality. Adrenals/Urinary Tract: Adrenal glands are unremarkable. Kidneys are normal, without renal calculi, focal lesion, or hydronephrosis. Bladder is unremarkable. Stomach/Bowel: Stomach is within normal limits. Appendix is diffusely dilated up to 1 cm with  surrounding fat stranding. No additional evidence of bowel wall thickening, distention, or inflammatory changes. Vascular/Lymphatic: No significant vascular findings are present. No enlarged abdominal or pelvic lymph nodes. Reproductive: Prostate is unremarkable. Other: No abdominal wall hernia or abnormality. No abdominopelvic ascites. Musculoskeletal: No acute or significant osseous findings. IMPRESSION: Acute, uncomplicated appendicitis. Marliss Coots, MD [facility] Radiology Electronically Signed   By: Marliss Coots M.D.   On: 06/24/2021 12:53   ? ?Procedures ?Procedures  ? ?Medications Ordered in ED ?Medications  ?cefTRIAXone (ROCEPHIN) 2 g in sodium chloride 0.9 % 100 mL IVPB (has no administration in time range)  ?  And  ?metroNIDAZOLE (FLAGYL) IVPB 500 mg (has no administration in time range)  ? ? ?ED Course/ Medical Decision Making/ A&P ?  ?                        ?Medical Decision Making ?Amount and/or Complexity of Data Reviewed ?Labs: ordered. ? ? ?  31 year old male presents to ED for evaluation of abdominal pain.  Please see HPI for details. ? ?On examination, the patient is afebrile, slightly tachycardic at 104, clear lung sounds bilaterally.  Patient endorses right lower quadrant tenderness, positive Murphy sign.  Patient nontoxic in appearance otherwise. ? ?Outpatient CT scan performed today shows acute uncomplicated appendicitis.  Further labs that were ordered on this patient include: ?- Lipase unremarkable ?- CMP unremarkable ?- CBC unremarkable, no elevated white blood cell count ?- Urinalysis shows ketones, hemoglobin, high specific gravity ? ?General surgery has been paged, Dr. Derrell Lollingamirez has agreed to see the patient for admission.  ? ? ?Final Clinical Impression(s) / ED Diagnoses ?Final diagnoses:  ?Acute appendicitis, unspecified acute appendicitis type  ? ? ?Rx / DC Orders ?ED Discharge Orders   ? ? None  ? ?  ? ? ?  ?Al DecantGroce, Britanny Marksberry F,  PA-C ?06/24/21 1918 ? ?  ?Virgina NorfolkCuratolo, Adam, DO ?06/24/21 2335 ? ?

## 2021-06-24 NOTE — Anesthesia Procedure Notes (Signed)
Procedure Name: Intubation ?Date/Time: 06/24/2021 9:26 PM ?Performed by: Molli Hazard, CRNA ?Pre-anesthesia Checklist: Patient identified, Emergency Drugs available, Suction available and Patient being monitored ?Patient Re-evaluated:Patient Re-evaluated prior to induction ?Oxygen Delivery Method: Circle system utilized ?Preoxygenation: Pre-oxygenation with 100% oxygen ?Induction Type: IV induction, Cricoid Pressure applied and Rapid sequence ?Laryngoscope Size: Hyacinth Meeker and 2 ?Grade View: Grade I ?Tube type: Oral ?Tube size: 7.5 mm ?Number of attempts: 1 ?Airway Equipment and Method: Stylet ?Placement Confirmation: ETT inserted through vocal cords under direct vision, positive ETCO2 and breath sounds checked- equal and bilateral ?Secured at: 23 cm ?Tube secured with: Tape ?Dental Injury: Teeth and Oropharynx as per pre-operative assessment  ? ? ? ? ?

## 2021-06-24 NOTE — H&P (Signed)
Donald Barron is an 31 y.o. male.   ?Chief Complaint: abd pain ?HPI: Pt is a 40 M with abd pain that began on Wed.  Pt states it was generalized than and then localized to the RLQ.  He took some ibuprofen which helped.  Pt states he had no fevers, chills, nausea, or vomitting. ? ?Pt saw his PCP this AM and was sent for a CT.  CT was s/f acute appendicitis. ?Pt was sent to the ED for further treatment. ? ?I reviewed the labs and CT personally. ? ?Past Medical History:  ?Diagnosis Date  ? Allergy   ? Anxiety   ? ? ?History reviewed. No pertinent surgical history. ? ?Family History  ?Problem Relation Age of Onset  ? Cancer Mother   ? ?Social History:  reports that he has never smoked. He does not have any smokeless tobacco history on file. No history on file for alcohol use and drug use. ? ?Allergies: No Known Allergies ? ?(Not in a hospital admission) ? ? ?Results for orders placed or performed during the hospital encounter of 06/24/21 (from the past 48 hour(s))  ?Urinalysis, Routine w reflex microscopic Urine, Clean Catch     Status: Abnormal  ? Collection Time: 06/24/21  4:18 PM  ?Result Value Ref Range  ? Color, Urine YELLOW YELLOW  ? APPearance CLEAR CLEAR  ? Specific Gravity, Urine >1.046 (H) 1.005 - 1.030  ? pH 5.0 5.0 - 8.0  ? Glucose, UA NEGATIVE NEGATIVE mg/dL  ? Hgb urine dipstick SMALL (A) NEGATIVE  ? Bilirubin Urine NEGATIVE NEGATIVE  ? Ketones, ur 5 (A) NEGATIVE mg/dL  ? Protein, ur NEGATIVE NEGATIVE mg/dL  ? Nitrite NEGATIVE NEGATIVE  ? Leukocytes,Ua NEGATIVE NEGATIVE  ? Bacteria, UA RARE (A) NONE SEEN  ? Mucus PRESENT   ?  Comment: Performed at [facility], [address], [place], [place] [address]  ?Lipase, blood     Status: None  ? Collection Time: 06/24/21  4:29 PM  ?Result Value Ref Range  ? Lipase 31 11 - 51 U/L  ?  Comment: Performed at [facility], [address], [place], [place] [address]  ?Comprehensive metabolic panel     Status: None  ? Collection Time: 06/24/21  4:29 PM   ?Result Value Ref Range  ? Sodium 138 135 - 145 mmol/L  ? Potassium 4.2 3.5 - 5.1 mmol/L  ? Chloride 104 98 - 111 mmol/L  ? CO2 23 22 - 32 mmol/L  ? Glucose, Bld 87 70 - 99 mg/dL  ?  Comment: Glucose reference range applies only to samples taken after fasting for at least 8 hours.  ? BUN 10 6 - 20 mg/dL  ? Creatinine, Ser 1.01 0.61 - 1.24 mg/dL  ? Calcium 9.0 8.9 - 10.3 mg/dL  ? Total Protein 7.1 6.5 - 8.1 g/dL  ? Albumin 4.3 3.5 - 5.0 g/dL  ? AST 29 15 - 41 U/L  ? ALT 43 0 - 44 U/L  ? Alkaline Phosphatase 41 38 - 126 U/L  ? Total Bilirubin 0.7 0.3 - 1.2 mg/dL  ? GFR, Estimated >60 >60 mL/min  ?  Comment: (NOTE) ?Calculated using the CKD-EPI Creatinine Equation (2021) ?  ? Anion gap 11 5 - 15  ?  Comment: Performed at [facility], [address], [place], [place] [address]  ?CBC     Status: None  ? Collection Time: 06/24/21  4:29 PM  ?Result Value Ref Range  ?  WBC 7.7 4.0 - 10.5 K/uL  ? RBC 4.86 4.22 - 5.81 MIL/uL  ? Hemoglobin 15.0 13.0 - 17.0 g/dL  ? HCT 45.2 39.0 - 52.0 %  ? MCV 93.0 80.0 - 100.0 fL  ? MCH 30.9 26.0 - 34.0 pg  ? MCHC 33.2 30.0 - 36.0 g/dL  ? RDW 12.2 11.5 - 15.5 %  ? Platelets 347 150 - 400 K/uL  ? nRBC 0.0 0.0 - 0.2 %  ?  Comment: Performed at [facility], [address] [place], [place] [address]  ?Resp Panel by RT-PCR (Flu A&B, Covid) Nasopharyngeal Swab     Status: None  ? Collection Time: 06/24/21  6:04 PM  ? Specimen: Nasopharyngeal Swab; Nasopharyngeal(NP) swabs in vial transport medium  ?Result Value Ref Range  ? SARS Coronavirus 2 by RT PCR NEGATIVE NEGATIVE  ?  Comment: (NOTE) ?SARS-CoV-2 target nucleic acids are NOT DETECTED. ? ?The SARS-CoV-2 RNA is generally detectable in upper respiratory ?specimens during the acute phase of infection. The lowest ?concentration of SARS-CoV-2 viral copies this assay can detect is ?138 copies/mL. A negative result does not preclude SARS-Cov-2 ?infection and should not be used as the sole basis for treatment or ?other patient management  decisions. A negative result may occur with  ?improper specimen collection/handling, submission of specimen other ?than nasopharyngeal swab, presence of viral mutation(s) within the ?areas targeted by this assay, and inadequate number of viral ?copies(<138 copies/mL). A negative result must be combined with ?clinical observations, patient history, and epidemiological ?information. The expected result is Negative. ? ?Fact Sheet for Patients:  ?BloggerCourse.comhttps://www.fda.gov/media/152166/download ? ?Fact Sheet for Healthcare Providers:  ?SeriousBroker.ithttps://www.fda.gov/media/152162/download ? ?This test is no t yet approved or cleared by the [place] FDA and  ?has been authorized for detection and/or diagnosis of SARS-CoV-2 by ?FDA under an Emergency Use Authorization (EUA). This EUA will remain  ?in effect (meaning this test can be used) for the duration of the ?COVID-19 declaration under Section 564(b)(1) of the Act, 21 ?U.S.C.section 360bbb-3(b)(1), unless the authorization is terminated  ?or revoked sooner.  ? ? ?  ? Influenza A by PCR NEGATIVE NEGATIVE  ? Influenza B by PCR NEGATIVE NEGATIVE  ?  Comment: (NOTE) ?The Xpert Xpress SARS-CoV-2/FLU/RSV plus assay is intended as an aid ?in the diagnosis of influenza from Nasopharyngeal swab specimens and ?should not be used as a sole basis for treatment. Nasal washings and ?aspirates are unacceptable for Xpert Xpress SARS-CoV-2/FLU/RSV ?testing. ? ?Fact Sheet for Patients: ?BloggerCourse.comhttps://www.fda.gov/media/152166/download ? ?Fact Sheet for Healthcare Providers: ?SeriousBroker.ithttps://www.fda.gov/media/152162/download ? ?This test is not yet approved or cleared by the [place] FDA and ?has been authorized for detection and/or diagnosis of SARS-CoV-2 by ?FDA under an Emergency Use Authorization (EUA). This EUA will remain ?in effect (meaning this test can be used) for the duration of the ?COVID-19 declaration under Section 564(b)(1) of the Act, 21 U.S.C. ?section 360bbb-3(b)(1), unless the authorization  is terminated or ?revoked. ? ?Performed at [facility], [address] [place], [place] [id number] ?  ? ?CT ABDOMEN PELVIS W CONTRAST ? ?Result Date: 06/24/2021 ?CLINICAL DATA:  31 year old male with clinical concern for appendicitis. EXAM: CT ABDOMEN AND PELVIS WITH CONTRAST TECHNIQUE: Multidetector CT imaging of the abdomen and pelvis was performed using the standard protocol following bolus administration of intravenous contrast. RADIATION DOSE REDUCTION: This exam was performed according to the departmental dose-optimization program which includes automated exposure control, adjustment of the mA and/or kV according to patient size and/or use of iterative reconstruction  technique. CONTRAST:  ISOVUE-300 IOPAMIDOL (ISOVUE-300) INJECTION 61% COMPARISON:  None. FINDINGS: Lower chest: No acute abnormality. Hepatobiliary: No focal liver abnormality is seen. No gallstones, gallbladder wall thickening, or biliary dilatation. Pancreas: Unremarkable. No pancreatic ductal dilatation or surrounding inflammatory changes. Spleen: Normal in size without focal abnormality. Adrenals/Urinary Tract: Adrenal glands are unremarkable. Kidneys are normal, without renal calculi, focal lesion, or hydronephrosis. Bladder is unremarkable. Stomach/Bowel: Stomach is within normal limits. Appendix is diffusely dilated up to 1 cm with surrounding fat stranding. No additional evidence of bowel wall thickening, distention, or inflammatory changes. Vascular/Lymphatic: No significant vascular findings are present. No enlarged abdominal or pelvic lymph nodes. Reproductive: Prostate is unremarkable. Other: No abdominal wall hernia or abnormality. No abdominopelvic ascites. Musculoskeletal: No acute or significant osseous findings. IMPRESSION: Acute, uncomplicated appendicitis. Marliss Coots, MD [facility] Radiology Electronically Signed   By: Marliss Coots M.D.   On: 06/24/2021 12:53    ? ?Review of Systems  ?Constitutional: Negative.   ?HENT: Negative.    ?Eyes: Negative.   ?Respiratory: Negative.    ?Cardiovascular: Negative.   ?Gastrointestinal:  Positive for abdominal pain.  ?Endocrin

## 2021-06-24 NOTE — Anesthesia Postprocedure Evaluation (Signed)
Anesthesia Post Note ? ?Patient: Donald Barron ? ?Procedure(s) Performed: APPENDECTOMY LAPAROSCOPIC (Abdomen) ? ?  ? ?Patient location during evaluation: PACU ?Anesthesia Type: General ?Level of consciousness: sedated ?Pain management: pain level controlled ?Vital Signs Assessment: post-procedure vital signs reviewed and stable ?Respiratory status: spontaneous breathing and respiratory function stable ?Cardiovascular status: stable ?Postop Assessment: no apparent nausea or vomiting ?Anesthetic complications: no ? ? ?No notable events documented. ? ?Last Vitals:  ?Vitals:  ? 06/24/21 2230 06/24/21 2245  ?BP: 117/77 130/84  ?Pulse: 89 (!) 106  ?Resp: (!) 7 17  ?Temp:    ?SpO2: (!) 89% 93%  ?  ?Last Pain:  ?Vitals:  ? 06/24/21 2245  ?TempSrc:   ?PainSc: 3   ? ? ?  ?  ?  ?  ?  ?  ? ?[name] ? ? ? ? ?

## 2021-06-24 NOTE — Op Note (Signed)
06/24/2021 ? ?10:09 PM ? ?PATIENT:  Donald Barron  31 y.o. male ? ?PRE-OPERATIVE DIAGNOSIS:  appendicitis ? ?POST-OPERATIVE DIAGNOSIS:  appendicitis ? ?PROCEDURE:  Procedure(s): ?APPENDECTOMY LAPAROSCOPIC (N/A) ? ?SURGEON:  Surgeon(s) and Role: ?   Axel Filler, MD - Primary ?ASSISTANTS: none  ? ?ANESTHESIA:   local and general ? ?EBL:  minimal  ? ?BLOOD ADMINISTERED:none ? ?DRAINS: none  ? ?LOCAL MEDICATIONS USED:  BUPIVICAINE  ? ?SPECIMEN:  Source of Specimen:  appendix ? ?DISPOSITION OF SPECIMEN:  PATHOLOGY ? ?COUNTS:  YES ? ?TOURNIQUET:  * No tourniquets in log * ? ?DICTATION: .Dragon Dictation ? ?Complications: none ? ?Counts: reported as correct x 2 ? ?Findings:  The patient had a acutely inflamed appendix ? ?Specimen: Appendix ? ?Indications for procedure:  The patient is a 31 year old male with a history of periumbilical pain localized in the right lower quadrant patient had a CT scan which revealed signs consistent with acute appendicitis the patient back in for laparoscopic appendectomy. ? ?Details of the procedure:The patient was taken back to the operating room. The patient was placed in supine position with bilateral SCDs in place. The patient was prepped and draped in the usual sterile fashion.  After appropriate anitbiotics were confirmed, a time-out was confirmed and all facts were verified.   ? ?A pneumoperitoneum of 14 mmHg was obtained via a Veress needle technique in the left lower quadrant quadrant.  A 5 mm trocar and 5 mm camera then placed intra-abdominally there is no injury to any intra-abdominal organs a 10 mm infraumbilical port was placed and direct visualization as was a 5 mm port in the suprapubic area.  ? ?The appendix was identified and seen to be non-perforated.  The appendix was cleaned down to the appendiceal base. The mesoappendix was then incised and the appendiceal artery was cauterized.  The the appendiceal base was clean.  A gold hemoclip was placed proximallyx2 and one  distally and the appendix was transected between these 2. A retrieval bag was then placed into the abdomen and the specimen placed in the bag. The appendiceal stump was cauterized. We evacuate the fluid from the pelvis until the effluent was clear.  The appendix and retrieval  bag was then retrieved via the supraumbilical port. #1 Vicryl was used to reapproximate the fascia at the umbilical port site x2. The skin was reapproximated all port sites 3-0 Monocryl subcuticular fashion. The skin was dressed with Dermabond.  The patient was awakened from general anesthesia was taken to recovery room in stable condition.   ? ? ?PLAN OF CARE: Discharge to home after PACU ? ?PATIENT DISPOSITION:  PACU - hemodynamically stable. ?  ?Delay start of Pharmacological VTE agent (>24hrs) due to surgical blood loss or risk of bleeding: not applicable ? ?

## 2021-06-24 NOTE — ED Triage Notes (Signed)
Pt arrives POV. Pt complaining of abd pain for a week with increase in pain on Tuesday. Pt was seen by PCP today and had outpatient CT scan which was positive for appendicitis. Afebrile. Abdominal pain centrally located. Pain 1/10. Pt VSS. NAD at this time.  ?

## 2021-06-24 NOTE — Transfer of Care (Signed)
Immediate Anesthesia Transfer of Care Note ? ?Patient: Donald Barron ? ?Procedure(s) Performed: APPENDECTOMY LAPAROSCOPIC (Abdomen) ? ?Patient Location: PACU ? ?Anesthesia Type:General ? ?Level of Consciousness: awake and sedated ? ?Airway & Oxygen Therapy: nasal cannula ? ?Post-op Assessment: Report given to RN and Post -op Vital signs reviewed and stable ? ?Post vital signs: Reviewed and stable ? ?Last Vitals:  ?Vitals Value Taken Time  ?BP 121/103   ?Temp 98.6   ?Pulse 95   ?Resp 16   ?SpO2 95   ? ? ?Last Pain:  ?Vitals:  ? 06/24/21 1800  ?TempSrc: Oral  ?PainSc: 0-No pain  ?   ? ?  ? ?Complications: No notable events documented. ?

## 2021-06-24 NOTE — ED Provider Triage Note (Signed)
Emergency Medicine Provider Triage Evaluation Note ? ?Donald Barron , a 31 y.o. male  was evaluated in triage.  Pt complains of abdominal pain of 2-day duration.  Pain was severe Tuesday evening and since then has been mild.  Patient had a visit with PCP today and had an outpatient CT scan done which was significant for appendicitis.  CT scan read is available in the chart.  Patient is afebrile.  Mild nausea.  Has been well tolerate p.o. intake. ? ?Review of Systems  ?Positive: As above ?Negative: As above ? ?Physical Exam  ?BP 139/90 (BP Location: Right Arm)   Pulse (!) 104   Temp 98.8 ?F (37.1 ?C) (Oral)   Resp 16   Ht 5\' 10"  (1.778 m)   Wt 115.2 kg   SpO2 93%   BMI 36.45 kg/m?  ?Gen:   Awake, no distress   ?Resp:  Normal effort  ?MSK:   Moves extremities without difficulty  ?Other:  Diffuse abdominal tenderness particularly in the right lower quadrant.  Involuntary guarding present. ? ?Medical Decision Making  ?Medically screening exam initiated at 4:26 PM.  Appropriate orders placed.  REJINO JOSEPHSEN was informed that the remainder of the evaluation will be completed by another provider, this initial triage assessment does not replace that evaluation, and the importance of remaining in the ED until their evaluation is complete. ? ? ?  ?Evlyn Courier, PA-C ?06/24/21 1628 ? ?

## 2021-06-24 NOTE — Anesthesia Preprocedure Evaluation (Addendum)
Anesthesia Evaluation  ?Patient identified by MRN, date of birth, ID band ?Patient awake ? ? ? ?Reviewed: ?Allergy & Precautions, NPO status , Patient's Chart, lab work & pertinent test results ? ?History of Anesthesia Complications ?Negative for: history of anesthetic complications ? ?Airway ?Mallampati: III ? ?TM Distance: >3 FB ?Neck ROM: Full ? ? ? Dental ?no notable dental hx. ?(+) Dental Advisory Given ?  ?Pulmonary ?neg pulmonary ROS,  ?  ?Pulmonary exam normal ? ? ? ? ? ? ? Cardiovascular ?negative cardio ROS ?Normal cardiovascular exam ? ? ?  ?Neuro/Psych ?PSYCHIATRIC DISORDERS Anxiety negative neurological ROS ?   ? GI/Hepatic ?Neg liver ROS,   ?Endo/Other  ?negative endocrine ROS ? Renal/GU ?negative Renal ROS  ? ?  ?Musculoskeletal ?negative musculoskeletal ROS ?(+)  ? Abdominal ?  ?Peds ? Hematology ?negative hematology ROS ?(+)   ?Anesthesia Other Findings ? ? Reproductive/Obstetrics ? ?  ? ? ? ? ? ? ? ? ? ? ? ? ? ?  ?  ? ? ? ? ? ? ? ?Anesthesia Physical ?Anesthesia Plan ? ?ASA: 2 and emergent ? ?Anesthesia Plan: General  ? ?Post-op Pain Management: Celebrex PO (pre-op)* and Tylenol PO (pre-op)*  ? ?Induction: Intravenous, Rapid sequence and Cricoid pressure planned ? ?PONV Risk Score and Plan: 4 or greater and Ondansetron, Midazolam and Scopolamine patch - Pre-op ? ?Airway Management Planned: Oral ETT ? ?Additional Equipment:  ? ?Intra-op Plan:  ? ?Post-operative Plan: Extubation in OR ? ?Informed Consent: I have reviewed the patients History and Physical, chart, labs and discussed the procedure including the risks, benefits and alternatives for the proposed anesthesia with the patient or authorized representative who has indicated his/her understanding and acceptance.  ? ? ? ?Dental advisory given ? ?Plan Discussed with: Anesthesiologist, CRNA and Surgeon ? ?Anesthesia Plan Comments:   ? ? ? ? ? ?Anesthesia Quick Evaluation ? ?

## 2021-06-24 NOTE — ED Notes (Signed)
Report given to CRNA. Pt taken to OR nad nard ?

## 2021-06-25 ENCOUNTER — Encounter (HOSPITAL_COMMUNITY): Payer: Self-pay | Admitting: General Surgery

## 2021-06-28 LAB — SURGICAL PATHOLOGY

## 2021-09-03 DIAGNOSIS — G479 Sleep disorder, unspecified: Secondary | ICD-10-CM | POA: Diagnosis not present

## 2021-09-03 DIAGNOSIS — R748 Abnormal levels of other serum enzymes: Secondary | ICD-10-CM | POA: Diagnosis not present

## 2021-09-03 DIAGNOSIS — F411 Generalized anxiety disorder: Secondary | ICD-10-CM | POA: Diagnosis not present

## 2021-09-03 DIAGNOSIS — Z1159 Encounter for screening for other viral diseases: Secondary | ICD-10-CM | POA: Diagnosis not present

## 2021-09-03 DIAGNOSIS — R1030 Lower abdominal pain, unspecified: Secondary | ICD-10-CM | POA: Diagnosis not present

## 2021-09-03 DIAGNOSIS — Z0000 Encounter for general adult medical examination without abnormal findings: Secondary | ICD-10-CM | POA: Diagnosis not present

## 2021-09-28 DIAGNOSIS — G4733 Obstructive sleep apnea (adult) (pediatric): Secondary | ICD-10-CM | POA: Diagnosis not present

## 2021-10-06 DIAGNOSIS — G4733 Obstructive sleep apnea (adult) (pediatric): Secondary | ICD-10-CM | POA: Diagnosis not present

## 2021-10-27 DIAGNOSIS — G4733 Obstructive sleep apnea (adult) (pediatric): Secondary | ICD-10-CM | POA: Diagnosis not present

## 2021-10-30 DIAGNOSIS — S3992XA Unspecified injury of lower back, initial encounter: Secondary | ICD-10-CM | POA: Diagnosis not present

## 2021-11-05 DIAGNOSIS — G4733 Obstructive sleep apnea (adult) (pediatric): Secondary | ICD-10-CM | POA: Diagnosis not present

## 2021-11-24 DIAGNOSIS — M545 Low back pain, unspecified: Secondary | ICD-10-CM | POA: Diagnosis not present

## 2021-11-24 DIAGNOSIS — R202 Paresthesia of skin: Secondary | ICD-10-CM | POA: Diagnosis not present

## 2021-11-25 DIAGNOSIS — G4733 Obstructive sleep apnea (adult) (pediatric): Secondary | ICD-10-CM | POA: Diagnosis not present

## 2021-12-06 DIAGNOSIS — G4733 Obstructive sleep apnea (adult) (pediatric): Secondary | ICD-10-CM | POA: Diagnosis not present

## 2021-12-23 DIAGNOSIS — G4733 Obstructive sleep apnea (adult) (pediatric): Secondary | ICD-10-CM | POA: Diagnosis not present

## 2021-12-26 DIAGNOSIS — G4733 Obstructive sleep apnea (adult) (pediatric): Secondary | ICD-10-CM | POA: Diagnosis not present

## 2021-12-30 DIAGNOSIS — Z713 Dietary counseling and surveillance: Secondary | ICD-10-CM | POA: Diagnosis not present

## 2022-01-04 DIAGNOSIS — R7309 Other abnormal glucose: Secondary | ICD-10-CM | POA: Diagnosis not present

## 2022-01-04 DIAGNOSIS — E669 Obesity, unspecified: Secondary | ICD-10-CM | POA: Diagnosis not present

## 2022-01-04 DIAGNOSIS — G4733 Obstructive sleep apnea (adult) (pediatric): Secondary | ICD-10-CM | POA: Diagnosis not present

## 2022-01-04 DIAGNOSIS — F411 Generalized anxiety disorder: Secondary | ICD-10-CM | POA: Diagnosis not present

## 2022-01-04 DIAGNOSIS — Z1331 Encounter for screening for depression: Secondary | ICD-10-CM | POA: Diagnosis not present

## 2022-01-06 DIAGNOSIS — G4733 Obstructive sleep apnea (adult) (pediatric): Secondary | ICD-10-CM | POA: Diagnosis not present

## 2022-01-18 DIAGNOSIS — Z713 Dietary counseling and surveillance: Secondary | ICD-10-CM | POA: Diagnosis not present

## 2022-01-19 DIAGNOSIS — G4733 Obstructive sleep apnea (adult) (pediatric): Secondary | ICD-10-CM | POA: Diagnosis not present

## 2022-01-19 DIAGNOSIS — F411 Generalized anxiety disorder: Secondary | ICD-10-CM | POA: Diagnosis not present

## 2022-01-24 DIAGNOSIS — Z23 Encounter for immunization: Secondary | ICD-10-CM | POA: Diagnosis not present

## 2022-01-24 DIAGNOSIS — G4733 Obstructive sleep apnea (adult) (pediatric): Secondary | ICD-10-CM | POA: Diagnosis not present

## 2022-01-24 DIAGNOSIS — Z6839 Body mass index (BMI) 39.0-39.9, adult: Secondary | ICD-10-CM | POA: Diagnosis not present

## 2022-01-24 DIAGNOSIS — R109 Unspecified abdominal pain: Secondary | ICD-10-CM | POA: Diagnosis not present

## 2022-01-25 DIAGNOSIS — G4733 Obstructive sleep apnea (adult) (pediatric): Secondary | ICD-10-CM | POA: Diagnosis not present

## 2022-01-27 DIAGNOSIS — G4733 Obstructive sleep apnea (adult) (pediatric): Secondary | ICD-10-CM | POA: Diagnosis not present

## 2022-02-05 DIAGNOSIS — G4733 Obstructive sleep apnea (adult) (pediatric): Secondary | ICD-10-CM | POA: Diagnosis not present

## 2022-02-15 DIAGNOSIS — F411 Generalized anxiety disorder: Secondary | ICD-10-CM | POA: Diagnosis not present

## 2022-02-15 DIAGNOSIS — G479 Sleep disorder, unspecified: Secondary | ICD-10-CM | POA: Diagnosis not present

## 2022-02-25 DIAGNOSIS — G4733 Obstructive sleep apnea (adult) (pediatric): Secondary | ICD-10-CM | POA: Diagnosis not present

## 2022-03-08 DIAGNOSIS — G4733 Obstructive sleep apnea (adult) (pediatric): Secondary | ICD-10-CM | POA: Diagnosis not present

## 2022-03-09 DIAGNOSIS — R0981 Nasal congestion: Secondary | ICD-10-CM | POA: Diagnosis not present

## 2022-03-09 DIAGNOSIS — R6883 Chills (without fever): Secondary | ICD-10-CM | POA: Diagnosis not present

## 2022-03-09 DIAGNOSIS — R059 Cough, unspecified: Secondary | ICD-10-CM | POA: Diagnosis not present

## 2022-03-09 DIAGNOSIS — Z03818 Encounter for observation for suspected exposure to other biological agents ruled out: Secondary | ICD-10-CM | POA: Diagnosis not present

## 2022-03-14 DIAGNOSIS — F411 Generalized anxiety disorder: Secondary | ICD-10-CM | POA: Diagnosis not present

## 2022-03-14 DIAGNOSIS — G479 Sleep disorder, unspecified: Secondary | ICD-10-CM | POA: Diagnosis not present

## 2022-03-27 DIAGNOSIS — G4733 Obstructive sleep apnea (adult) (pediatric): Secondary | ICD-10-CM | POA: Diagnosis not present

## 2022-04-07 DIAGNOSIS — G4733 Obstructive sleep apnea (adult) (pediatric): Secondary | ICD-10-CM | POA: Diagnosis not present

## 2022-04-12 DIAGNOSIS — F411 Generalized anxiety disorder: Secondary | ICD-10-CM | POA: Diagnosis not present

## 2022-04-21 ENCOUNTER — Ambulatory Visit: Payer: BC Managed Care – PPO

## 2022-04-21 DIAGNOSIS — Z03818 Encounter for observation for suspected exposure to other biological agents ruled out: Secondary | ICD-10-CM | POA: Diagnosis not present

## 2022-04-21 DIAGNOSIS — J029 Acute pharyngitis, unspecified: Secondary | ICD-10-CM | POA: Diagnosis not present

## 2022-04-21 DIAGNOSIS — R6883 Chills (without fever): Secondary | ICD-10-CM | POA: Diagnosis not present

## 2022-04-21 DIAGNOSIS — R051 Acute cough: Secondary | ICD-10-CM | POA: Diagnosis not present

## 2022-04-21 DIAGNOSIS — R059 Cough, unspecified: Secondary | ICD-10-CM | POA: Diagnosis not present

## 2022-04-21 DIAGNOSIS — J209 Acute bronchitis, unspecified: Secondary | ICD-10-CM | POA: Diagnosis not present

## 2022-04-25 DIAGNOSIS — G4733 Obstructive sleep apnea (adult) (pediatric): Secondary | ICD-10-CM | POA: Diagnosis not present

## 2022-04-27 DIAGNOSIS — G4733 Obstructive sleep apnea (adult) (pediatric): Secondary | ICD-10-CM | POA: Diagnosis not present

## 2022-04-28 DIAGNOSIS — R0981 Nasal congestion: Secondary | ICD-10-CM | POA: Diagnosis not present

## 2022-04-28 DIAGNOSIS — J209 Acute bronchitis, unspecified: Secondary | ICD-10-CM | POA: Diagnosis not present

## 2022-04-29 DIAGNOSIS — G4733 Obstructive sleep apnea (adult) (pediatric): Secondary | ICD-10-CM | POA: Diagnosis not present

## 2022-05-08 DIAGNOSIS — G4733 Obstructive sleep apnea (adult) (pediatric): Secondary | ICD-10-CM | POA: Diagnosis not present

## 2022-05-10 DIAGNOSIS — Z9189 Other specified personal risk factors, not elsewhere classified: Secondary | ICD-10-CM | POA: Diagnosis not present

## 2022-05-10 DIAGNOSIS — F411 Generalized anxiety disorder: Secondary | ICD-10-CM | POA: Diagnosis not present

## 2022-05-23 DIAGNOSIS — K432 Incisional hernia without obstruction or gangrene: Secondary | ICD-10-CM | POA: Diagnosis not present

## 2022-05-23 DIAGNOSIS — E669 Obesity, unspecified: Secondary | ICD-10-CM | POA: Diagnosis not present

## 2022-05-23 DIAGNOSIS — F411 Generalized anxiety disorder: Secondary | ICD-10-CM | POA: Diagnosis not present

## 2022-05-23 DIAGNOSIS — J209 Acute bronchitis, unspecified: Secondary | ICD-10-CM | POA: Diagnosis not present

## 2022-05-23 DIAGNOSIS — F410 Panic disorder [episodic paroxysmal anxiety] without agoraphobia: Secondary | ICD-10-CM | POA: Diagnosis not present

## 2022-05-26 DIAGNOSIS — G4733 Obstructive sleep apnea (adult) (pediatric): Secondary | ICD-10-CM | POA: Diagnosis not present

## 2022-06-08 DIAGNOSIS — G4733 Obstructive sleep apnea (adult) (pediatric): Secondary | ICD-10-CM | POA: Diagnosis not present

## 2022-06-24 DIAGNOSIS — G4733 Obstructive sleep apnea (adult) (pediatric): Secondary | ICD-10-CM | POA: Diagnosis not present

## 2022-06-28 DIAGNOSIS — G4733 Obstructive sleep apnea (adult) (pediatric): Secondary | ICD-10-CM | POA: Diagnosis not present

## 2022-06-28 DIAGNOSIS — E669 Obesity, unspecified: Secondary | ICD-10-CM | POA: Diagnosis not present

## 2022-06-28 DIAGNOSIS — R030 Elevated blood-pressure reading, without diagnosis of hypertension: Secondary | ICD-10-CM | POA: Diagnosis not present

## 2022-07-07 DIAGNOSIS — G4733 Obstructive sleep apnea (adult) (pediatric): Secondary | ICD-10-CM | POA: Diagnosis not present

## 2022-07-24 DIAGNOSIS — G4733 Obstructive sleep apnea (adult) (pediatric): Secondary | ICD-10-CM | POA: Diagnosis not present

## 2022-07-29 DIAGNOSIS — G4733 Obstructive sleep apnea (adult) (pediatric): Secondary | ICD-10-CM | POA: Diagnosis not present

## 2022-08-23 DIAGNOSIS — G4733 Obstructive sleep apnea (adult) (pediatric): Secondary | ICD-10-CM | POA: Diagnosis not present

## 2022-08-27 DIAGNOSIS — K219 Gastro-esophageal reflux disease without esophagitis: Secondary | ICD-10-CM | POA: Diagnosis not present

## 2022-08-27 DIAGNOSIS — R09A2 Foreign body sensation, throat: Secondary | ICD-10-CM | POA: Diagnosis not present

## 2022-09-08 DIAGNOSIS — Z6838 Body mass index (BMI) 38.0-38.9, adult: Secondary | ICD-10-CM | POA: Diagnosis not present

## 2022-09-08 DIAGNOSIS — Z0000 Encounter for general adult medical examination without abnormal findings: Secondary | ICD-10-CM | POA: Diagnosis not present

## 2022-09-08 DIAGNOSIS — Z113 Encounter for screening for infections with a predominantly sexual mode of transmission: Secondary | ICD-10-CM | POA: Diagnosis not present

## 2022-09-08 DIAGNOSIS — F411 Generalized anxiety disorder: Secondary | ICD-10-CM | POA: Diagnosis not present

## 2022-09-08 DIAGNOSIS — E669 Obesity, unspecified: Secondary | ICD-10-CM | POA: Diagnosis not present

## 2022-09-08 DIAGNOSIS — K219 Gastro-esophageal reflux disease without esophagitis: Secondary | ICD-10-CM | POA: Diagnosis not present

## 2022-09-08 DIAGNOSIS — R7989 Other specified abnormal findings of blood chemistry: Secondary | ICD-10-CM | POA: Diagnosis not present

## 2022-09-08 DIAGNOSIS — Z23 Encounter for immunization: Secondary | ICD-10-CM | POA: Diagnosis not present

## 2022-09-08 DIAGNOSIS — R748 Abnormal levels of other serum enzymes: Secondary | ICD-10-CM | POA: Diagnosis not present

## 2022-09-08 DIAGNOSIS — B079 Viral wart, unspecified: Secondary | ICD-10-CM | POA: Diagnosis not present

## 2022-09-08 DIAGNOSIS — F410 Panic disorder [episodic paroxysmal anxiety] without agoraphobia: Secondary | ICD-10-CM | POA: Diagnosis not present

## 2022-09-23 DIAGNOSIS — G4733 Obstructive sleep apnea (adult) (pediatric): Secondary | ICD-10-CM | POA: Diagnosis not present

## 2022-09-27 DIAGNOSIS — K432 Incisional hernia without obstruction or gangrene: Secondary | ICD-10-CM | POA: Diagnosis not present

## 2022-10-22 DIAGNOSIS — G4733 Obstructive sleep apnea (adult) (pediatric): Secondary | ICD-10-CM | POA: Diagnosis not present

## 2022-10-28 DIAGNOSIS — Z8719 Personal history of other diseases of the digestive system: Secondary | ICD-10-CM | POA: Diagnosis not present

## 2022-10-28 DIAGNOSIS — G4733 Obstructive sleep apnea (adult) (pediatric): Secondary | ICD-10-CM | POA: Diagnosis not present

## 2022-10-28 DIAGNOSIS — K432 Incisional hernia without obstruction or gangrene: Secondary | ICD-10-CM | POA: Diagnosis not present

## 2022-11-22 DIAGNOSIS — G4733 Obstructive sleep apnea (adult) (pediatric): Secondary | ICD-10-CM | POA: Diagnosis not present

## 2022-12-23 DIAGNOSIS — G4733 Obstructive sleep apnea (adult) (pediatric): Secondary | ICD-10-CM | POA: Diagnosis not present

## 2023-01-20 DIAGNOSIS — G4733 Obstructive sleep apnea (adult) (pediatric): Secondary | ICD-10-CM | POA: Diagnosis not present

## 2023-02-20 DIAGNOSIS — G4733 Obstructive sleep apnea (adult) (pediatric): Secondary | ICD-10-CM | POA: Diagnosis not present

## 2023-03-22 DIAGNOSIS — G4733 Obstructive sleep apnea (adult) (pediatric): Secondary | ICD-10-CM | POA: Diagnosis not present

## 2023-04-23 DIAGNOSIS — G4733 Obstructive sleep apnea (adult) (pediatric): Secondary | ICD-10-CM | POA: Diagnosis not present

## 2023-05-24 DIAGNOSIS — G4733 Obstructive sleep apnea (adult) (pediatric): Secondary | ICD-10-CM | POA: Diagnosis not present

## 2023-06-08 DIAGNOSIS — L918 Other hypertrophic disorders of the skin: Secondary | ICD-10-CM | POA: Diagnosis not present

## 2023-06-08 DIAGNOSIS — L718 Other rosacea: Secondary | ICD-10-CM | POA: Diagnosis not present

## 2023-06-08 DIAGNOSIS — L723 Sebaceous cyst: Secondary | ICD-10-CM | POA: Diagnosis not present

## 2023-06-08 DIAGNOSIS — B078 Other viral warts: Secondary | ICD-10-CM | POA: Diagnosis not present

## 2023-06-08 DIAGNOSIS — B354 Tinea corporis: Secondary | ICD-10-CM | POA: Diagnosis not present

## 2023-06-21 DIAGNOSIS — G4733 Obstructive sleep apnea (adult) (pediatric): Secondary | ICD-10-CM | POA: Diagnosis not present

## 2023-07-04 DIAGNOSIS — Z6839 Body mass index (BMI) 39.0-39.9, adult: Secondary | ICD-10-CM | POA: Diagnosis not present

## 2023-07-04 DIAGNOSIS — G4733 Obstructive sleep apnea (adult) (pediatric): Secondary | ICD-10-CM | POA: Diagnosis not present

## 2023-07-04 DIAGNOSIS — F410 Panic disorder [episodic paroxysmal anxiety] without agoraphobia: Secondary | ICD-10-CM | POA: Diagnosis not present

## 2023-07-04 DIAGNOSIS — F411 Generalized anxiety disorder: Secondary | ICD-10-CM | POA: Diagnosis not present

## 2023-07-19 DIAGNOSIS — F410 Panic disorder [episodic paroxysmal anxiety] without agoraphobia: Secondary | ICD-10-CM | POA: Diagnosis not present

## 2023-07-19 DIAGNOSIS — F411 Generalized anxiety disorder: Secondary | ICD-10-CM | POA: Diagnosis not present

## 2023-08-29 DIAGNOSIS — G4733 Obstructive sleep apnea (adult) (pediatric): Secondary | ICD-10-CM | POA: Diagnosis not present

## 2023-08-31 DIAGNOSIS — Z20828 Contact with and (suspected) exposure to other viral communicable diseases: Secondary | ICD-10-CM | POA: Diagnosis not present

## 2023-09-13 DIAGNOSIS — Z0000 Encounter for general adult medical examination without abnormal findings: Secondary | ICD-10-CM | POA: Diagnosis not present

## 2023-09-13 DIAGNOSIS — F411 Generalized anxiety disorder: Secondary | ICD-10-CM | POA: Diagnosis not present

## 2023-09-13 DIAGNOSIS — F410 Panic disorder [episodic paroxysmal anxiety] without agoraphobia: Secondary | ICD-10-CM | POA: Diagnosis not present

## 2023-09-13 DIAGNOSIS — Z131 Encounter for screening for diabetes mellitus: Secondary | ICD-10-CM | POA: Diagnosis not present

## 2023-09-13 DIAGNOSIS — E786 Lipoprotein deficiency: Secondary | ICD-10-CM | POA: Diagnosis not present

## 2023-09-13 DIAGNOSIS — Z130 Encounter for screening for diseases of the blood and blood-forming organs and certain disorders involving the immune mechanism: Secondary | ICD-10-CM | POA: Diagnosis not present

## 2023-11-28 DIAGNOSIS — G4733 Obstructive sleep apnea (adult) (pediatric): Secondary | ICD-10-CM | POA: Diagnosis not present

## 2023-11-29 DIAGNOSIS — G4733 Obstructive sleep apnea (adult) (pediatric): Secondary | ICD-10-CM | POA: Diagnosis not present

## 2024-01-10 DIAGNOSIS — Z6839 Body mass index (BMI) 39.0-39.9, adult: Secondary | ICD-10-CM | POA: Diagnosis not present

## 2024-01-10 DIAGNOSIS — K219 Gastro-esophageal reflux disease without esophagitis: Secondary | ICD-10-CM | POA: Diagnosis not present

## 2024-01-10 DIAGNOSIS — Z23 Encounter for immunization: Secondary | ICD-10-CM | POA: Diagnosis not present

## 2024-03-26 DIAGNOSIS — K219 Gastro-esophageal reflux disease without esophagitis: Secondary | ICD-10-CM | POA: Diagnosis not present

## 2024-03-26 DIAGNOSIS — K921 Melena: Secondary | ICD-10-CM | POA: Diagnosis not present

## 2024-04-07 DIAGNOSIS — J988 Other specified respiratory disorders: Secondary | ICD-10-CM | POA: Diagnosis not present
